# Patient Record
Sex: Female | Born: 1945 | Race: Black or African American | Hispanic: No | Marital: Married | Smoking: Never smoker
Health system: Southern US, Community
[De-identification: ages and names within clinical notes are randomized; demographics above are authoritative.]

## PROBLEM LIST (undated history)

## (undated) DIAGNOSIS — I10 Essential (primary) hypertension: Secondary | ICD-10-CM

## (undated) DIAGNOSIS — E7800 Pure hypercholesterolemia, unspecified: Secondary | ICD-10-CM

## (undated) DIAGNOSIS — I2510 Atherosclerotic heart disease of native coronary artery without angina pectoris: Secondary | ICD-10-CM

## (undated) HISTORY — PX: TONSILLECTOMY: SUR1361

## (undated) HISTORY — PX: CARDIAC CATHETERIZATION: SHX172

## (undated) HISTORY — PX: ABDOMINAL HYSTERECTOMY: SHX81

---

## 2006-06-04 ENCOUNTER — Emergency Department (HOSPITAL_COMMUNITY): Admission: EM | Admit: 2006-06-04 | Discharge: 2006-06-04 | Payer: Self-pay | Admitting: Emergency Medicine

## 2006-12-10 IMAGING — CR DG CHEST 2V
2 series · 2 of 2 positions shown · non-contrast
Comparison: None.

CLINICAL DATA: Cough, weakness. 
 CHEST ? 2 VIEW:

[view not recorded (1 of 2)]
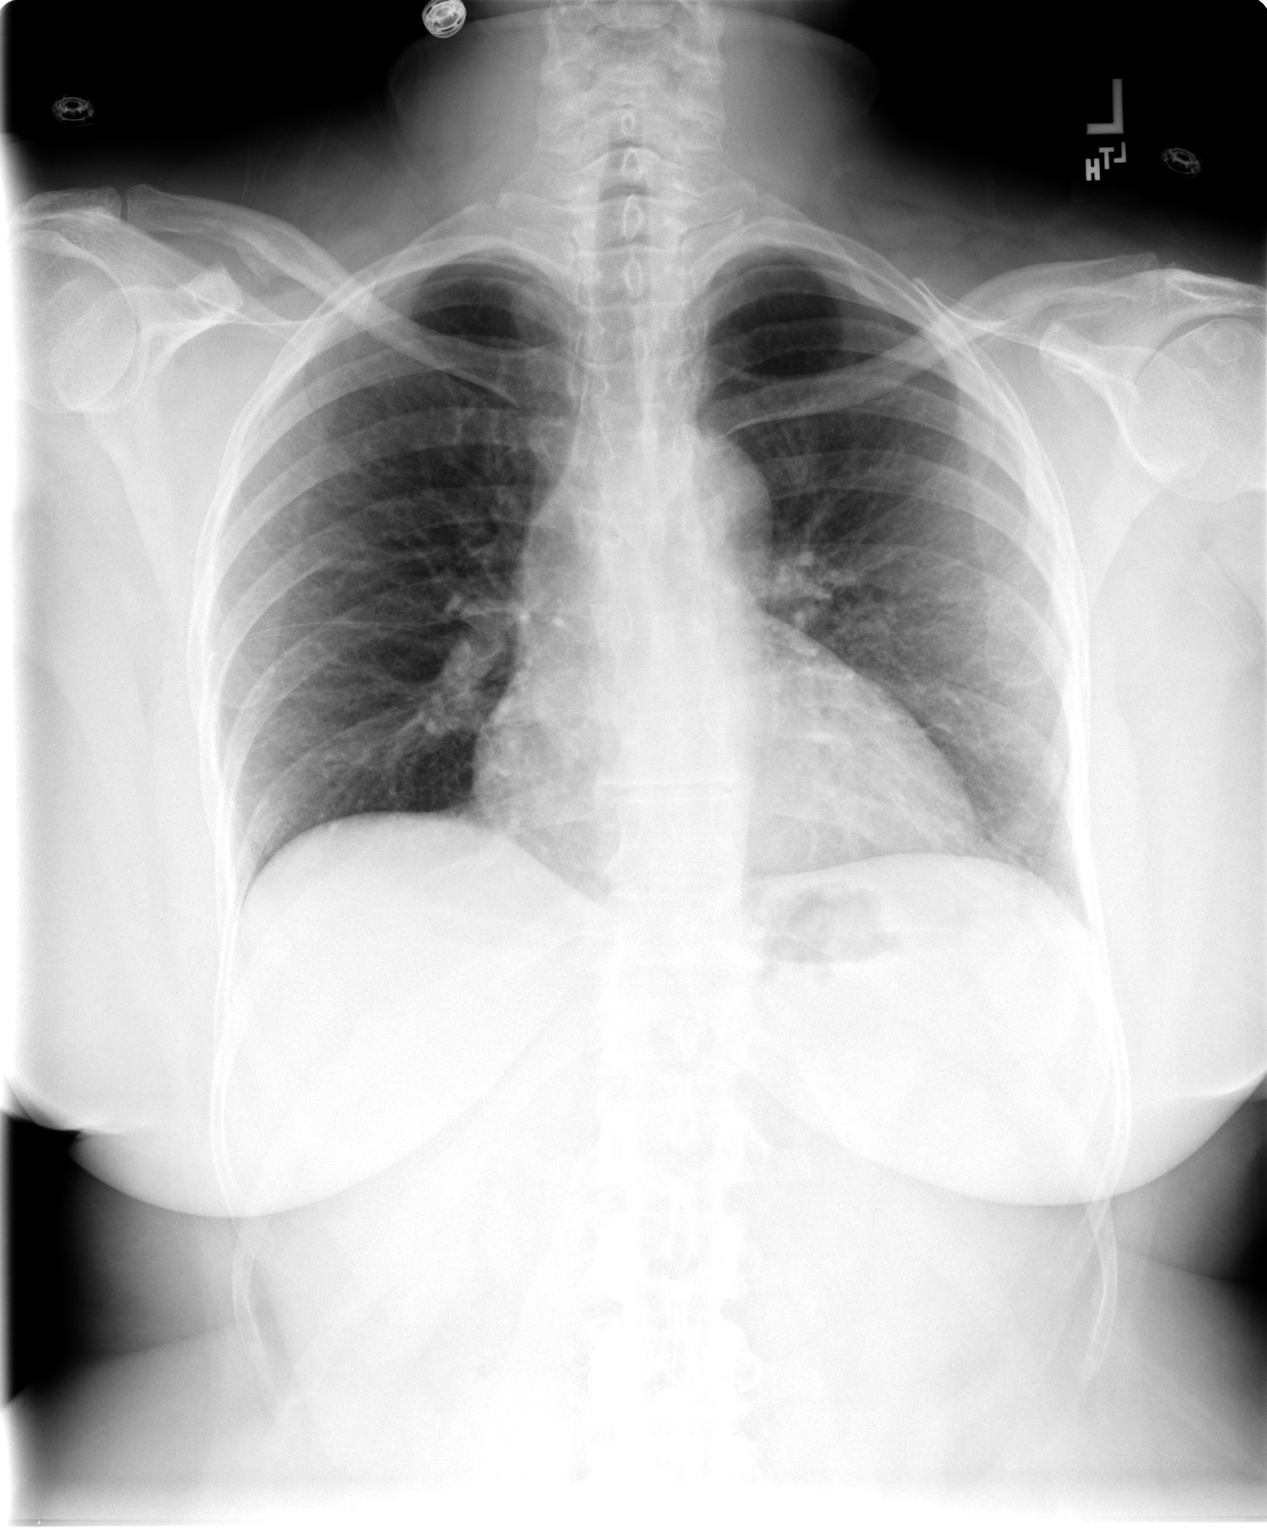

[view not recorded (2 of 2)]
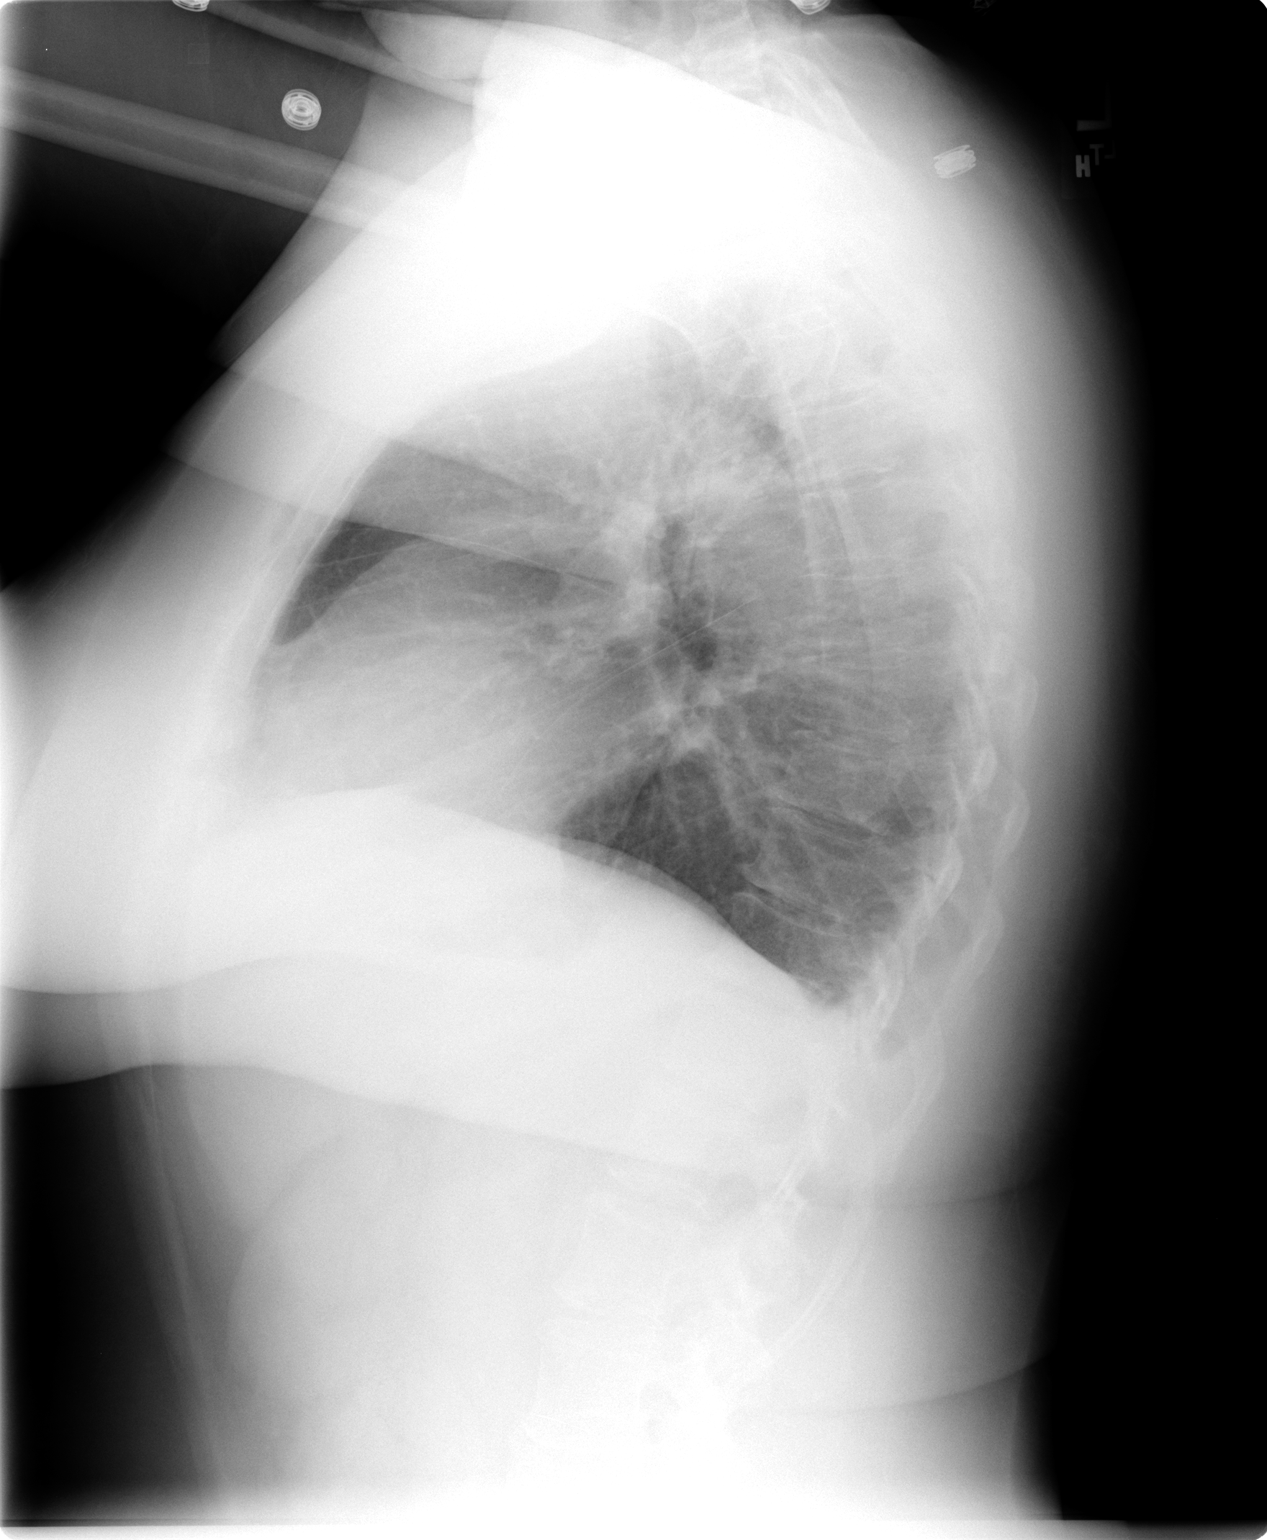

[2 of 2 positions shown; findings below may reference images not displayed]

FINDINGS: Cardiomediastinal silhouette is within normal limits.  Lung volumes are low with crowding of the bronchovascular markings but no focal pulmonary opacity.  No pleural effusion.  Osseous structures are grossly intact.
IMPRESSION: Low lung volumes.  No focal pulmonary process.

## 2011-07-18 ENCOUNTER — Other Ambulatory Visit: Payer: Self-pay

## 2011-07-18 ENCOUNTER — Encounter: Payer: Self-pay | Admitting: *Deleted

## 2011-07-18 ENCOUNTER — Emergency Department (HOSPITAL_COMMUNITY): Payer: Medicare Other

## 2011-07-18 ENCOUNTER — Emergency Department (HOSPITAL_COMMUNITY)
Admission: EM | Admit: 2011-07-18 | Discharge: 2011-07-18 | Disposition: A | Discharge status: Home / Self Care | Payer: Medicare Other | Attending: Emergency Medicine | Admitting: Emergency Medicine

## 2011-07-18 DIAGNOSIS — J45909 Unspecified asthma, uncomplicated: Secondary | ICD-10-CM | POA: Insufficient documentation

## 2011-07-18 DIAGNOSIS — I2510 Atherosclerotic heart disease of native coronary artery without angina pectoris: Secondary | ICD-10-CM | POA: Insufficient documentation

## 2011-07-18 DIAGNOSIS — I498 Other specified cardiac arrhythmias: Secondary | ICD-10-CM | POA: Insufficient documentation

## 2011-07-18 DIAGNOSIS — E876 Hypokalemia: Secondary | ICD-10-CM | POA: Insufficient documentation

## 2011-07-18 DIAGNOSIS — Z79899 Other long term (current) drug therapy: Secondary | ICD-10-CM | POA: Insufficient documentation

## 2011-07-18 DIAGNOSIS — E7800 Pure hypercholesterolemia, unspecified: Secondary | ICD-10-CM | POA: Insufficient documentation

## 2011-07-18 DIAGNOSIS — I10 Essential (primary) hypertension: Secondary | ICD-10-CM | POA: Insufficient documentation

## 2011-07-18 DIAGNOSIS — R9431 Abnormal electrocardiogram [ECG] [EKG]: Secondary | ICD-10-CM | POA: Insufficient documentation

## 2011-07-18 DIAGNOSIS — R079 Chest pain, unspecified: Secondary | ICD-10-CM | POA: Insufficient documentation

## 2011-07-18 HISTORY — DX: Essential (primary) hypertension: I10

## 2011-07-18 HISTORY — DX: Pure hypercholesterolemia, unspecified: E78.00

## 2011-07-18 HISTORY — DX: Atherosclerotic heart disease of native coronary artery without angina pectoris: I25.10

## 2011-07-18 LAB — BASIC METABOLIC PANEL
BUN: 8 mg/dL (ref 6–23)
Calcium: 9.8 mg/dL (ref 8.4–10.5)
Chloride: 101 mEq/L (ref 96–112)
Creatinine, Ser: 0.62 mg/dL (ref 0.50–1.10)
GFR calc Af Amer: >60 mL/min (ref >60)
GFR calc non Af Amer: >60 mL/min (ref >60)
Glucose, Bld: 108 mg/dL — ABNORMAL HIGH (ref 70–99)
Sodium: 140 mEq/L (ref 135–145)

## 2011-07-18 LAB — CBC
HCT: 41.4 % (ref 36.0–46.0)
Hemoglobin: 14.3 g/dL (ref 12.0–15.0)
MCV: 87 fL (ref 78.0–100.0)
Platelets: 160 10*3/uL (ref 150–400)

## 2011-07-18 LAB — CARDIAC PANEL(CRET KIN+CKTOT+MB+TROPI): CK, MB: 2.9 ng/mL (ref 0.3–4.0)

## 2011-07-18 NOTE — ED Notes (Signed)
Pt self ambulated out with a steady gait stating no needs 

## 2011-07-18 NOTE — ED Notes (Signed)
Pt c/o pain in the center of her chest with shortness of breath and facial numbness since this am. Pt also c/o headache. States that she took 2 nitroglycerins this am and a baby asa with relief.

## 2011-07-18 NOTE — ED Notes (Signed)
Assisted pt to bathroom

## 2011-07-18 NOTE — ED Provider Notes (Addendum)
Scribed for Nicholes Stairs, MD, the patient was seen in room 9. This chart was scribed by Ellie Lunch. This patient's care was started at 13:42.   CSN: [id number] Arrival date & time: 07/18/2011 11:53 AM  Chief Complaint  Patient presents with  . Chest Pain   HPI Amber Clay is a 65 y.o. female with a history of CAD, HTN, and high cholesterol presents to the Emergency Department complaining of intermittent left chest pain. Patient woke up this morning to a non-radiating, tightness in her left chest. She says pain lasted ~30 minutes and resolved after and 1 nitroglycerin. Later she was able to go on a walk and go shopping and was free of chest pain. The later in the day, the chest tightness returned after eating; patient took another nitroglycerin which again alleviated pain within a few minutes. Patient says both episodes of chest pain were accompanied with a "hot" feeling but she denies nausea, vomiting, diaphoresis, SOB, or chest pain on exertion. She denies a history of DM, liver or kidney issues, or tobacco use. She takes 81mg  ASA daily.  Patient has a history of cardiac catheterization in [place] in ~ 2009-2010, she was prescribed nitroglycerin but did not receive a stent at that time. Her Cardiologist is in [place] and she will be returning there in ~3 days. There are no other associated symptoms and no other alleviating or aggravating factors.   HPI ELEMENTS:  Location: Left Chest  Onset: This morning Duration: a few minutes per episode  Timing: 2 episodes since this morning   Quality: a "tightness"   Modifying factors: improved with nitroglycerin  Context: as above  Associated symptoms: a hot feeling   Past Medical History  Diagnosis Date  . Asthma   . Hypertension   . High cholesterol   . Coronary artery disease     Past Surgical History  Procedure Date  . Abdominal hysterectomy   . Tonsillectomy   . Cardiac catheterization    MEDICATIONS:    Nitroglycerin  Previous Medications   ACETAMINOPHEN (TYLENOL) 500 MG TABLET    Take 500 mg by mouth every 6 (six) hours as needed. FOR PAIN    ALBUTEROL (VENTOLIN HFA) 108 (90 BASE) MCG/ACT INHALER    Inhale 2 puffs into the lungs every 6 (six) hours as needed. FOR SHORTNESS OF BREATH    ALPRAZOLAM (XANAX) 0.5 MG TABLET    Take 0.25 mg by mouth at bedtime as needed. FOR ANXIETY    AMLODIPINE-BENAZEPRIL (LOTREL) 5-20 MG PER CAPSULE    Take 1 capsule by mouth daily.     CARBOXYMETHYLCELLULOSE (REFRESH PLUS) 0.5 % SOLN    Place 2 drops into both eyes 3 (three) times daily as needed. FOR IRRITATED EYES    FLUTICASONE-SALMETEROL (ADVAIR DISKUS) 250-50 MCG/DOSE AEPB    Inhale 1 puff into the lungs every 12 (twelve) hours. FOR SHORTNESS OF BREATH    METOPROLOL SUCCINATE (TOPROL-XL) 25 MG 24 HR TABLET    Take 25 mg by mouth daily.     OMEPRAZOLE (PRILOSEC) 20 MG CAPSULE    Take 20 mg by mouth daily as needed. FOR HEARTBURN    VALSARTAN-HYDROCHLOROTHIAZIDE (DIOVAN-HCT) 160-12.5 MG PER TABLET    Take 1 tablet by mouth daily.       ALLERGIES:  Allergies as of 07/18/2011 - Review Complete 07/18/2011  Allergen Reaction Noted  . Shellfish allergy Swelling 07/18/2011  . Darvon Other (See Comments) 07/18/2011    FAMILY HISTORY:  No Pertinent Family History  Social History: Accompanied by mother Patient lives in [place]  History  Substance Use Topics  . Smoking status: Never Smoker   . Smokeless tobacco: Not on file  . Alcohol Use: No    Review of Systems  Constitutional: Negative for diaphoresis.  Respiratory: Negative for shortness of breath.   Cardiovascular: Positive for chest pain.  Gastrointestinal: Negative for nausea and vomiting.  All other systems reviewed and are negative.    Physical Exam  BP 113/74  Pulse 55  Temp(Src) 98.3 F (36.8 C) (Oral)  Resp 18  Ht 5\' 4"  (1.626 m)  Wt 152 lb (68.947 kg)  BMI 26.09 kg/m2  SpO2 99%  Physical Exam  Constitutional: She is  oriented to person, place, and time. She appears well-developed and well-nourished.  HENT:  Head: Normocephalic and atraumatic.  Mouth/Throat: Oropharynx is clear and moist.  Eyes: Conjunctivae and EOM are normal. Pupils are equal, round, and reactive to light.  Neck: Normal range of motion. Neck supple. Carotid bruit is not present.  Cardiovascular: Normal rate, regular rhythm and intact distal pulses.   No murmur heard.      No bruit left or right  Pulmonary/Chest: Effort normal and breath sounds normal. No respiratory distress. She exhibits no tenderness.  Abdominal: Soft. There is no tenderness.  Musculoskeletal: Normal range of motion. She exhibits no edema and no tenderness.       No calf tenderness   Neurological: She is alert and oriented to person, place, and time.  Skin: Skin is warm and dry.  Psychiatric: She has a normal mood and affect. Her behavior is normal.    OTHER DATA REVIEWED: Nursing notes, vital signs, and past medical records reviewed.  DIAGNOSTIC STUDIES: Oxygen Saturation is 99% on 3 liters/min via Patient connected to nasal cannula oxygen, normal by my interpretation.    EKG  Date: 07/18/2011  Rate: 72  Rhythm: normal sinus rhythm and sinus arrythmia  QRS Axis: normal  Intervals: QT prolonged  ST/T Wave abnormalities: nonspecific ST/T changes  Conduction Disutrbances: none  Old EKG Reviewed: none available  LABS / RADIOLOGY:  Results for orders placed during the hospital encounter of 07/18/11  CBC      Component Value Range   WBC 5.9  4.0 - 10.5 (K/uL)   RBC 4.76  3.87 - 5.11 (MIL/uL)   Hemoglobin 14.3  12.0 - 15.0 (g/dL)   HCT 40.9  81.1 - 91.4 (%)   MCV 87.0  78.0 - 100.0 (fL)   MCH 30.0  26.0 - 34.0 (pg)   MCHC 34.5  30.0 - 36.0 (g/dL)   RDW 78.2  95.6 - 21.3 (%)   Platelets 160  150 - 400 (K/uL)  BASIC METABOLIC PANEL      Component Value Range   Sodium 140  135 - 145 (mEq/L)   Potassium 3.2 (*) 3.5 - 5.1 (mEq/L)   Chloride 101  96 -  112 (mEq/L)   CO2 22  19 - 32 (mEq/L)   Glucose, Bld 108 (*) 70 - 99 (mg/dL)   BUN 8  6 - 23 (mg/dL)   Creatinine, Ser 0.86  0.50 - 1.10 (mg/dL)   Calcium 9.8  8.4 - 57.8 (mg/dL)   GFR calc non Af Amer >60  >60 (mL/min)   GFR calc Af Amer >60  >60 (mL/min)  CARDIAC PANEL(CRET KIN+CKTOT+MB+TROPI)      Component Value Range   Total CK 118  7 - 177 (U/L)   CK, MB 2.9  0.3 -  4.0 (ng/mL)   Troponin I <0.30  <0.30 (ng/mL)   Relative Index 2.5  0.0 - 2.5    CHEST XRAY : 2 View; Interpreted by Radiologist Dr. Arnell Sieving, M.D. and reviewed by me: No acute cardiopulmonary disease.   ED COURSE / COORDINATION OF CARE: 14:04 - ED physician discussed results with the patient and family, advised patient to return to ED if chest pain comes back or persists for a longer period of time. Labs show mild hypokalemia, ED physician advised patient to eat bananas and drink orange juice.   MDM:  She has does not smoke or have dm, htn, hyperchol.  She was catheterized in [place] in 2009. She was told she did not have severe disease, so no stent was place. However, she was given ntg. Now when she gets cp or even "fluttering" in her heart she takes ntg.  She had chest 'tightness" today while at rest and when eating. She had no other sxs.   She did not get cp while walking.  Her tightness went away with a single ntg twice.  Her ecg and blood tests do not show ami or nonstemi.  She lives close by.  I do not think she has angina nor unstable angina.  Will release.  She can easily return for worse symptoms. She understands and agrees with plan.   I personally performed the services described in this documentation, which was scribed in my presence. The recorded information has been reviewed and considered. No att. providers found     IMPRESSION: Diagnoses that have been ruled out:  Diagnoses that are still under consideration:  Final diagnoses:  Chest pain, unspecified    PLAN:  Home  The patient is to  return the emergency department if there is any worsening of symptoms. I have reviewed the discharge instructions with the patient.   CONDITION ON DISCHARGE: Stable   MEDICATIONS GIVEN IN THE E.D.  Medications  Fluticasone-Salmeterol (ADVAIR DISKUS) 250-50 MCG/DOSE AEPB (not administered)  albuterol (VENTOLIN HFA) 108 (90 BASE) MCG/ACT inhaler (not administered)  valsartan-hydrochlorothiazide (DIOVAN-HCT) 160-12.5 MG per tablet (not administered)  metoprolol succinate (TOPROL-XL) 25 MG 24 hr tablet (not administered)  amLODipine-benazepril (LOTREL) 5-20 MG per capsule (not administered)  ALPRAZolam (XANAX) 0.5 MG tablet (not administered)  carboxymethylcellulose (REFRESH PLUS) 0.5 % SOLN (not administered)  omeprazole (PRILOSEC) 20 MG capsule (not administered)  acetaminophen (TYLENOL) 500 MG tablet (not administered)     DISCHARGE MEDICATIONS: New Prescriptions   No medications on file    Procedures       Nicholes Stairs, MD 07/18/11 1557  Nicholes Stairs, MD 07/18/11 1558

## 2017-01-11 ENCOUNTER — Other Ambulatory Visit (INDEPENDENT_AMBULATORY_CARE_PROVIDER_SITE_OTHER): Payer: Self-pay | Admitting: Orthopaedic Surgery

## 2017-01-11 DIAGNOSIS — M25511 Pain in right shoulder: Secondary | ICD-10-CM

## 2018-01-24 ENCOUNTER — Other Ambulatory Visit: Payer: Self-pay

## 2018-01-24 ENCOUNTER — Emergency Department (HOSPITAL_COMMUNITY)
Admission: EM | Admit: 2018-01-24 | Discharge: 2018-01-24 | Disposition: A | Discharge status: Home / Self Care | Payer: Medicare Other | Attending: Emergency Medicine | Admitting: Emergency Medicine

## 2018-01-24 ENCOUNTER — Encounter (HOSPITAL_COMMUNITY): Payer: Self-pay

## 2018-01-24 ENCOUNTER — Emergency Department (HOSPITAL_COMMUNITY): Payer: Medicare Other

## 2018-01-24 DIAGNOSIS — Z79899 Other long term (current) drug therapy: Secondary | ICD-10-CM | POA: Diagnosis not present

## 2018-01-24 DIAGNOSIS — I10 Essential (primary) hypertension: Secondary | ICD-10-CM | POA: Insufficient documentation

## 2018-01-24 DIAGNOSIS — I2510 Atherosclerotic heart disease of native coronary artery without angina pectoris: Secondary | ICD-10-CM | POA: Diagnosis not present

## 2018-01-24 DIAGNOSIS — E876 Hypokalemia: Secondary | ICD-10-CM | POA: Diagnosis not present

## 2018-01-24 DIAGNOSIS — J45909 Unspecified asthma, uncomplicated: Secondary | ICD-10-CM | POA: Insufficient documentation

## 2018-01-24 DIAGNOSIS — R0789 Other chest pain: Secondary | ICD-10-CM

## 2018-01-24 LAB — CBC
HEMATOCRIT: 42.7 % (ref 36.0–46.0)
HEMOGLOBIN: 14.1 g/dL (ref 12.0–15.0)
MCH: 29 pg (ref 26.0–34.0)
MCHC: 33 g/dL (ref 30.0–36.0)
MCV: 87.7 fL (ref 78.0–100.0)
Platelets: 189 10*3/uL (ref 150–400)
RBC: 4.87 MIL/uL (ref 3.87–5.11)
RDW: 12.8 % (ref 11.5–15.5)
WBC: 6.3 10*3/uL (ref 4.0–10.5)

## 2018-01-24 LAB — BASIC METABOLIC PANEL
ANION GAP: 13 (ref 5–15)
BUN: 10 mg/dL (ref 6–20)
CHLORIDE: 102 mmol/L (ref 101–111)
CO2: 25 mmol/L (ref 22–32)
Calcium: 9.5 mg/dL (ref 8.9–10.3)
Creatinine, Ser: 0.9 mg/dL (ref 0.44–1.00)
Glucose, Bld: 115 mg/dL — ABNORMAL HIGH (ref 65–99)
POTASSIUM: 3.2 mmol/L — AB (ref 3.5–5.1)
SODIUM: 140 mmol/L (ref 135–145)

## 2018-01-24 LAB — I-STAT TROPONIN, ED
TROPONIN I, POC: 0 ng/mL (ref 0.00–0.08)
Troponin i, poc: 0 ng/mL (ref 0.00–0.08)

## 2018-01-24 MED ORDER — POTASSIUM CHLORIDE CRYS ER 20 MEQ PO TBCR
20.0000 meq | EXTENDED_RELEASE_TABLET | Freq: Once | ORAL | Status: AC
  Administered 2018-01-24: 20 meq via ORAL
  Filled 2018-01-24: qty 1

## 2018-01-24 MED ORDER — IPRATROPIUM-ALBUTEROL 0.5-2.5 (3) MG/3ML IN SOLN
3.0000 mL | Freq: Once | RESPIRATORY_TRACT | Status: AC
  Administered 2018-01-24: 3 mL via RESPIRATORY_TRACT
  Filled 2018-01-24: qty 3

## 2018-01-24 MED ORDER — POTASSIUM CHLORIDE CRYS ER 20 MEQ PO TBCR: 20.0000 meq | EXTENDED_RELEASE_TABLET | Freq: Every day | ORAL | 0 refills | Status: AC

## 2018-01-24 NOTE — ED Notes (Signed)
Denies any chest pain at present time.

## 2018-01-24 NOTE — ED Provider Notes (Signed)
[facility]ERGENCY DEPARTMENT Provider Note   CSN: [id number] Arrival date & time: 01/24/18  1041     History   Chief Complaint Chief Complaint  Patient presents with  . Chest Pain    HPI Amber Clay is a 72 y.o. female.  Pt presents to the ED today with CP that started light night.  The pt said she feels like it's a "flutter."  She lives in [place], but has been here b/c her mom is scheduled  for a heart cath [facility] a few days.  She is a little anxious about that.  She did have a cath in 2009 in [place].  She is not sure if she had a stent or not.       Past Medical History:  Diagnosis Date  . Asthma   . Coronary artery disease   . High cholesterol   . Hypertension     There are no active problems to display for this patient.   Past Surgical History:  Procedure Laterality Date  . ABDOMINAL HYSTERECTOMY    . CARDIAC CATHETERIZATION    . TONSILLECTOMY      OB History    No data available       Home Medications    Prior to Admission medications   Medication Sig Start Date End Date Taking? Authorizing Provider  acetaminophen (TYLENOL) 500 MG tablet Take 500 mg by mouth every 6 (six) hours as needed. FOR PAIN    Yes [provider]  albuterol (VENTOLIN HFA) 108 (90 BASE) MCG/ACT inhaler Inhale 2 puffs into the lungs every 6 (six) hours as needed. FOR SHORTNESS OF BREATH    Yes [provider]  ALPRAZolam (XANAX) 0.5 MG tablet Take 0.25 mg by mouth at bedtime as needed. FOR ANXIETY    Yes [provider]  amLODipine-benazepril (LOTREL) 5-20 MG per capsule Take 1 capsule by mouth daily.     Yes [provider]  carboxymethylcellulose (REFRESH PLUS) 0.5 % SOLN Place 2 drops into both eyes 3 (three) times daily as needed. FOR IRRITATED EYES    Yes [provider]  Fluticasone-Salmeterol (ADVAIR DISKUS) 250-50 MCG/DOSE AEPB Inhale 1 puff into the lungs every 12 (twelve) hours. FOR  SHORTNESS OF BREATH    Yes [provider]  meclizine (ANTIVERT) 25 MG tablet Take 25 mg by mouth 3 (three) times daily. 01/08/18  Yes [provider]  metoprolol succinate (TOPROL-XL) 25 MG 24 hr tablet Take 25 mg by mouth daily.     Yes [provider]  omeprazole (PRILOSEC) 20 MG capsule Take 20 mg by mouth daily as needed. FOR HEARTBURN    Yes [provider]  valsartan-hydrochlorothiazide (DIOVAN-HCT) 160-12.5 MG per tablet Take 1 tablet by mouth daily.     Yes [provider]  potassium chloride SA (K-DUR,KLOR-CON) 20 MEQ tablet Take 1 tablet (20 mEq total) by mouth daily. 01/24/18   Jacalyn LefevreHaviland, Carson Meche, MD    Family History No family history on file.  Social History Social History   Tobacco Use  . Smoking status: Never Smoker  . Smokeless tobacco: Never Used  Substance Use Topics  . Alcohol use: No  . Drug use: No     Allergies   Shellfish allergy and Darvon   Review of Systems Review of Systems  Cardiovascular: Positive for chest pain.  All other systems reviewed and are negative.    Physical Exam Updated Vital Signs BP 120/78   Pulse 89  Temp 97.7 F (36.5 C) (Oral)   Resp 15   Ht 5\' 4"  (1.626 m)   Wt 72.6 kg (160 lb)   SpO2 93%   BMI 27.46 kg/m   Physical Exam  Constitutional: She is oriented to person, place, and time. She appears well-developed and well-nourished.  HENT:  Head: Normocephalic and atraumatic.  Eyes: EOM are normal. Pupils are equal, round, and reactive to light.  Neck: Normal range of motion. Neck supple.  Cardiovascular: Normal rate, regular rhythm, intact distal pulses and normal pulses.  Pulmonary/Chest: Effort normal and breath sounds normal.  Abdominal: Soft. Bowel sounds are normal.  Musculoskeletal: Normal range of motion.       Right lower leg: Normal.       Left lower leg: Normal.  Neurological: She is alert and oriented to person, place, and time.  Skin: Skin is warm and dry.  Capillary refill takes less than 2 seconds.  Psychiatric: She has a normal mood and affect. Her behavior is normal.  Nursing note and vitals reviewed.    ED Treatments / Results  Labs (all labs ordered are listed, but only abnormal results are displayed) Labs Reviewed  BASIC METABOLIC PANEL - Abnormal; Notable for the following components:      Result Value   Potassium 3.2 (*)    Glucose, Bld 115 (*)    All other components within normal limits  CBC  I-STAT TROPONIN, ED  I-STAT TROPONIN, ED    EKG  EKG Interpretation  Date/Time:  Wednesday January 24 2018 11:05:51 EST Ventricular Rate:  87 PR Interval:  170 QRS Duration: 80 QT Interval:  388 QTC Calculation: 466 R Axis:   -6 Text Interpretation:  Normal sinus rhythm Inferior infarct , age undetermined Anterolateral infarct , age undetermined Abnormal ECG No old tracing to compare Confirmed by Jacalyn Lefevre [phone]) on 01/24/2018 11:10:59 AM       Radiology Dg Chest 2 View  Result Date: 01/24/2018 CLINICAL DATA:  Chest pain and shortness of breath since yesterday. EXAM: CHEST  2 VIEW COMPARISON:  Single-view of the chest 07/18/2011. PA and lateral chest 06/04/2006. FINDINGS: The lungs are clear. Heart size is upper normal. No pneumothorax or pleural effusion. No acute bony abnormality. IMPRESSION: No acute disease. Electronically Signed   By: Drusilla Kanner M.D.   On: 01/24/2018 13:11    Procedures Procedures (including critical care time)  Medications Ordered in ED Medications  potassium chloride SA (K-DUR,KLOR-CON) CR tablet 20 mEq (not administered)  ipratropium-albuterol (DUONEB) 0.5-2.5 (3) MG/3ML nebulizer solution 3 mL (3 mLs Nebulization Given 01/24/18 1159)     Initial Impression / Assessment and Plan / ED Course  I have reviewed the triage vital signs and the nursing notes.  Pertinent labs & imaging results that were available during my care of the patient were reviewed by me and considered in my  medical decision making (see chart for details).    Pt is feeling better.  Heart score of 3 with 2 negative troponins.  Pt is stable for d/c.  She knows to return if worse.  Final Clinical Impressions(s) / ED Diagnoses   Final diagnoses:  Atypical chest pain  Hypokalemia    ED Discharge Orders        Ordered    potassium chloride SA (K-DUR,KLOR-CON) 20 MEQ tablet  Daily     01/24/18 1409       Jacalyn Lefevre, MD 01/24/18 [phone]

## 2018-01-24 NOTE — ED Triage Notes (Signed)
Left arm pain that has happened for months, but chest tightness that started last night. States it's a lot of pressure. Has a stent in her heart. Rates pain as a 7/10. Pt states she has had some nausea

## 2018-08-01 IMAGING — DX DG CHEST 2V
2 series · 2 of 2 positions shown · non-contrast
Comparison: Single-view of the chest 07/18/2011. PA and lateral
chest 06/04/2006.

CLINICAL DATA: Chest pain and shortness of breath since yesterday.

EXAM:
CHEST  2 VIEW

[chest pa]
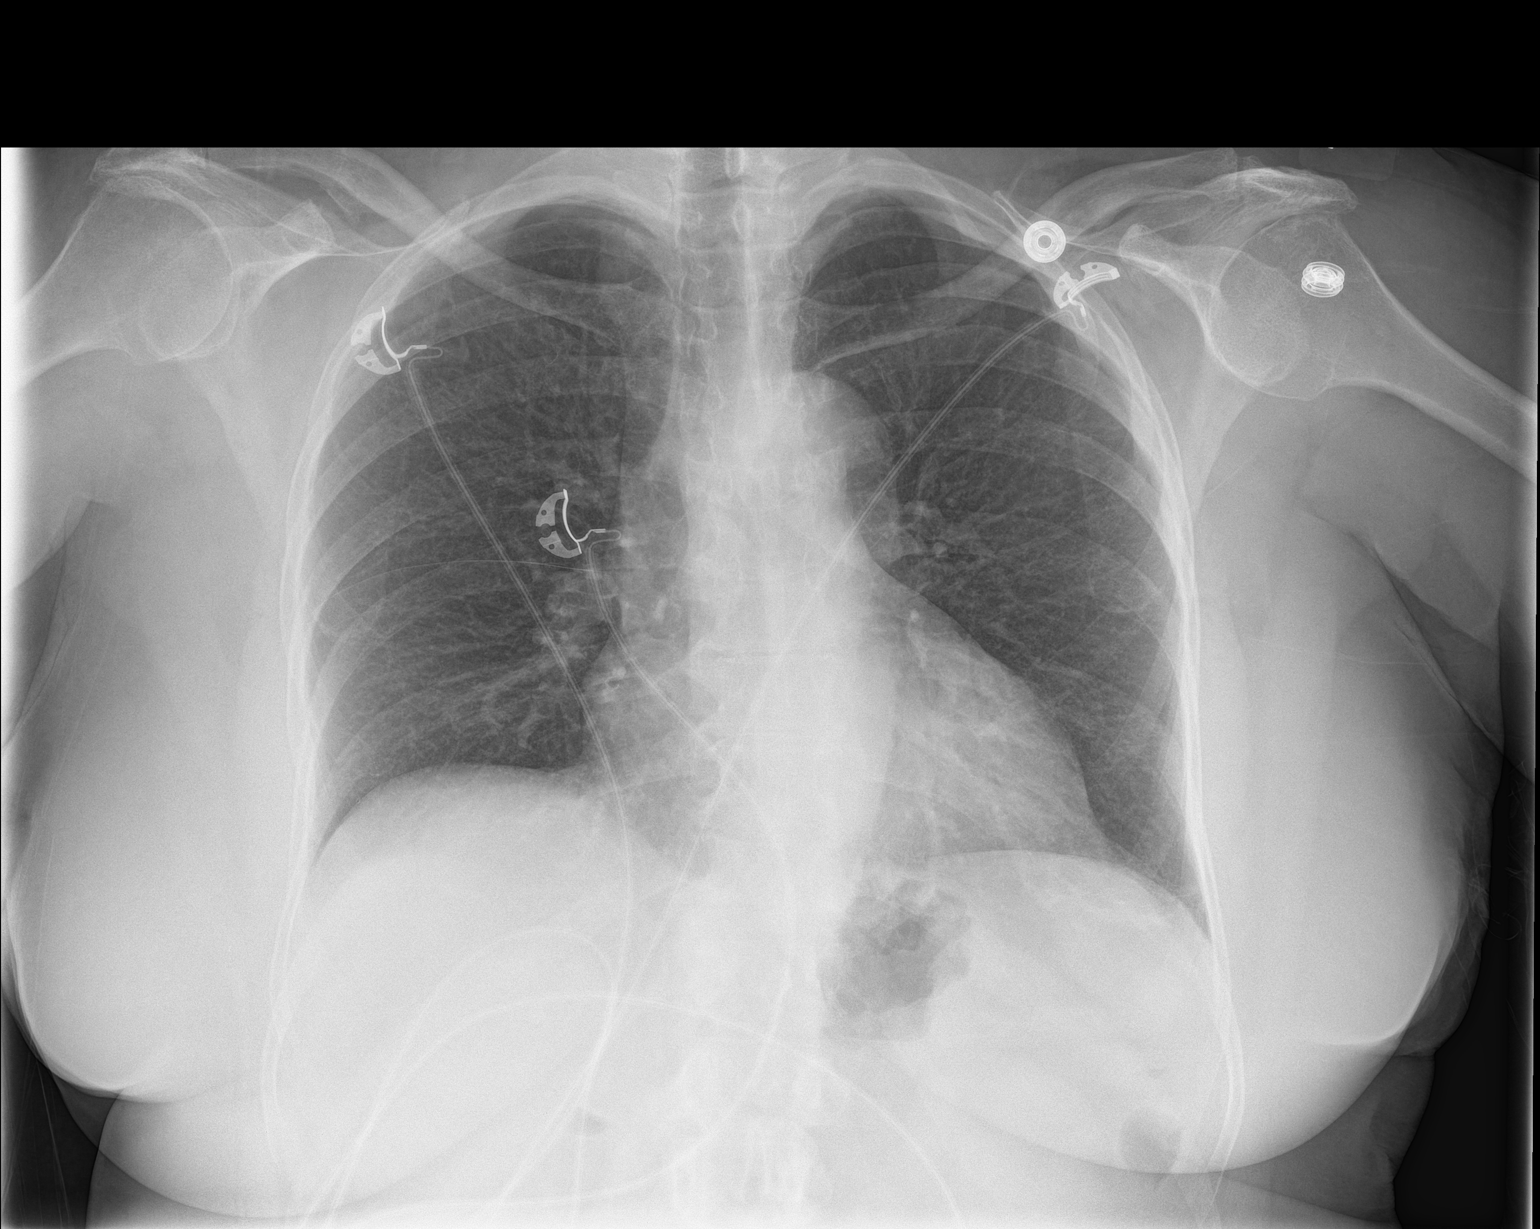

[chest lat]
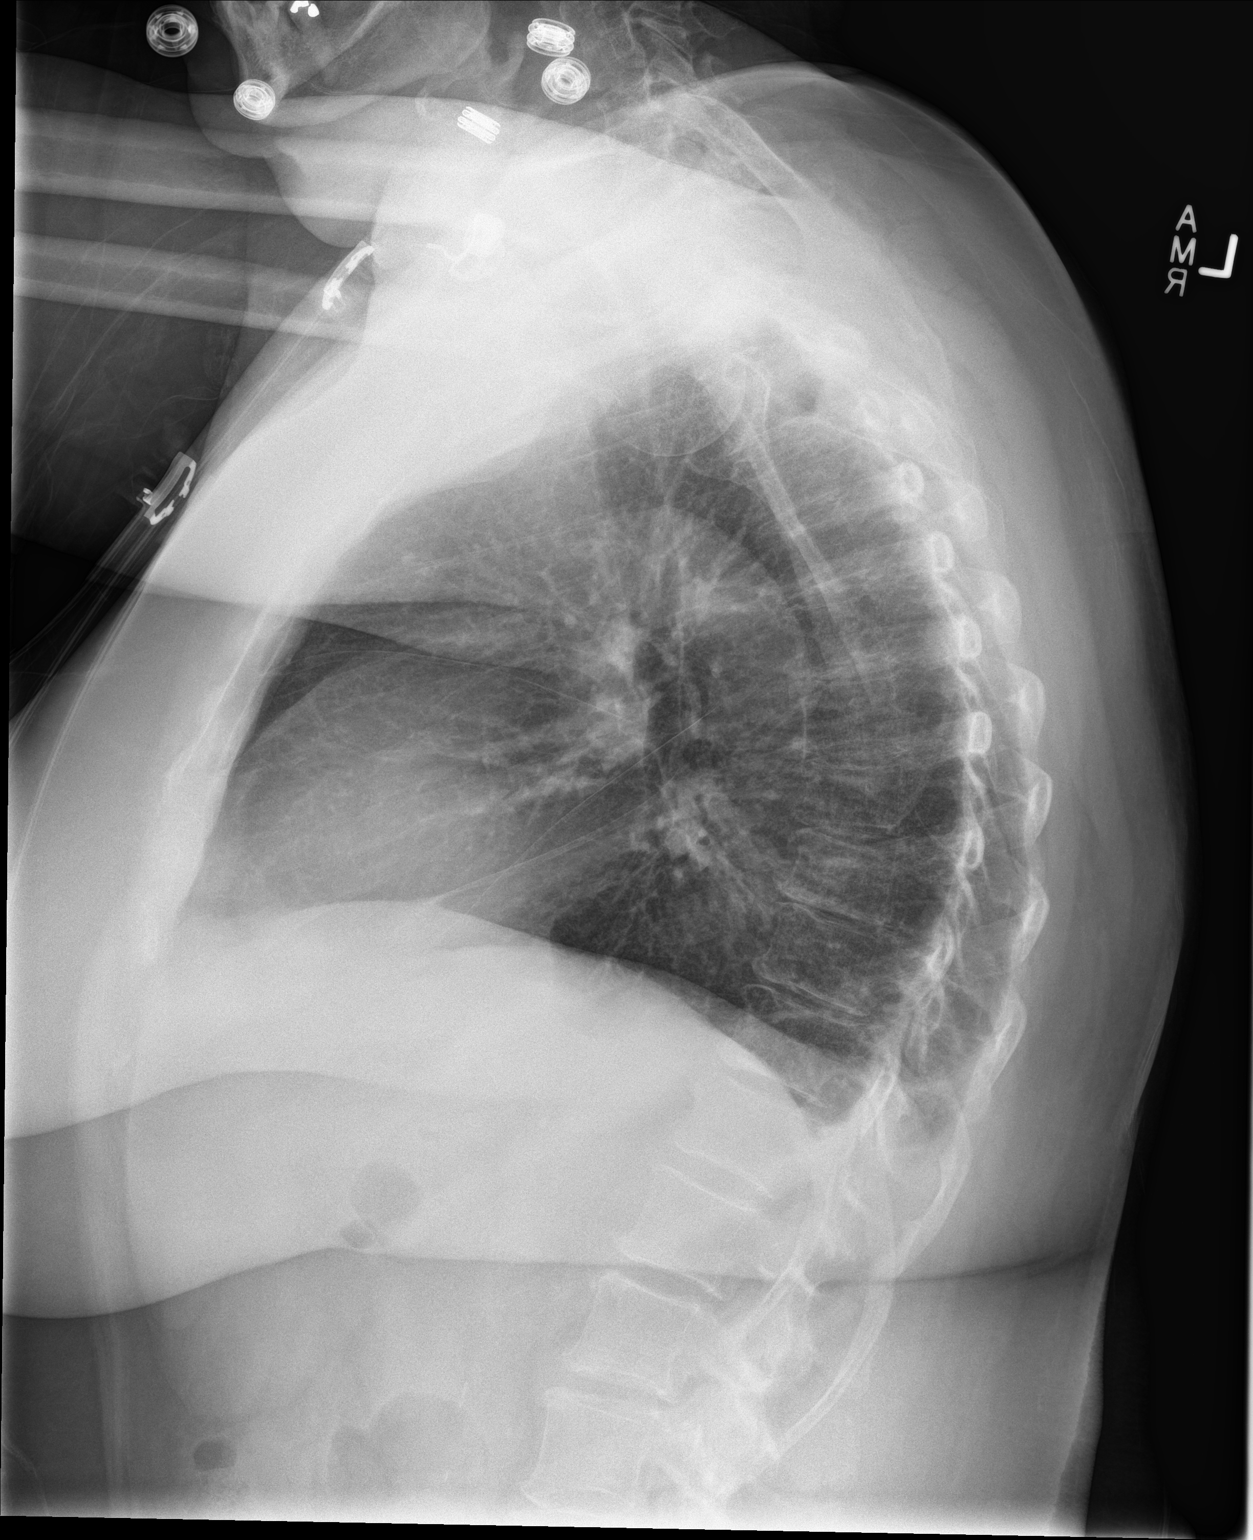

[2 of 2 positions shown; findings below may reference images not displayed]

FINDINGS: The lungs are clear. Heart size is upper normal. No pneumothorax or
pleural effusion. No acute bony abnormality.
IMPRESSION: No acute disease.

## 2019-01-07 DIAGNOSIS — R9439 Abnormal result of other cardiovascular function study: Secondary | ICD-10-CM | POA: Insufficient documentation

## 2021-02-02 DIAGNOSIS — E1165 Type 2 diabetes mellitus with hyperglycemia: Secondary | ICD-10-CM | POA: Insufficient documentation

## 2021-04-24 DIAGNOSIS — F339 Major depressive disorder, recurrent, unspecified: Secondary | ICD-10-CM | POA: Insufficient documentation

## 2022-07-01 ENCOUNTER — Encounter: Payer: Self-pay | Admitting: Orthopedic Surgery

## 2022-07-01 ENCOUNTER — Ambulatory Visit (INDEPENDENT_AMBULATORY_CARE_PROVIDER_SITE_OTHER): Payer: Medicare Other | Admitting: Orthopedic Surgery

## 2022-07-01 DIAGNOSIS — S52592A Other fractures of lower end of left radius, initial encounter for closed fracture: Secondary | ICD-10-CM | POA: Diagnosis not present

## 2022-07-01 DIAGNOSIS — K219 Gastro-esophageal reflux disease without esophagitis: Secondary | ICD-10-CM | POA: Insufficient documentation

## 2022-07-01 DIAGNOSIS — I10 Essential (primary) hypertension: Secondary | ICD-10-CM | POA: Insufficient documentation

## 2022-07-01 DIAGNOSIS — I2510 Atherosclerotic heart disease of native coronary artery without angina pectoris: Secondary | ICD-10-CM | POA: Insufficient documentation

## 2022-07-01 DIAGNOSIS — J45909 Unspecified asthma, uncomplicated: Secondary | ICD-10-CM | POA: Insufficient documentation

## 2022-07-01 NOTE — Patient Instructions (Signed)
Tylenol 500 mg every 6 hours is fine for pain  You can supplement this with ibuprofen 800 mg every 8 hours for pain

## 2022-07-01 NOTE — Progress Notes (Addendum)
Patient ID: Amber Clay, female   DOB: Jan 04, 1946, 76 y.o.   MRN: [id number]  ASSESSMENT AND PLAN  Minimally displaced distal radius fracture left wrist recommend cast  X-ray in cast in 2 weeks  Chief Complaint  Patient presents with   Wrist Injury    Left 06/28/22     HPI Amber Clay is a 76 y.o. female.   77 yo female s/p fall inj left wrist , seen at urgent care. C/o left wrist pain    Review of Systems Review of Systems Neuro sensation normal   Past Medical History:  Diagnosis Date   Asthma    Coronary artery disease    High cholesterol    Hypertension     Past Surgical History:  Procedure Laterality Date   ABDOMINAL HYSTERECTOMY     CARDIAC CATHETERIZATION     TONSILLECTOMY        Physical Exam 1 There were no vitals taken for this visit.  2 The patient is well developed well nourished and well groomed. 3 Orientation to person place and time is normal  4 Mood is pleasant.  5 Ambulatory status normal   6 Inspection of the left  wrist reveals mild  tenderness   mild  swelling no  deformity 7 Range of motion assessment: The range of motion is diminished primarily secondary to pain 8 Stability tests are deferred because of pain but the x-ray shows no subluxation of the joint 9 Strength assessment muscle tone is normal resistance testing is deferred because of pain and swelling  10 Nerve function normal  11 Vascular function normal  12 Local lymphatic system neg   Opposite extremity right there is no alignment abnormality, no contracture, no subluxation, no atrophy and neurovascular exam is intact  MEDICAL DECISION MAKING  A.  Encounter Diagnosis  Name Primary?   Other closed fracture of distal end of left radius, initial encounter Yes    B. DATA ANALYSED:  Images on a disc there now in the media nondisplaced distal radius fracture minimal to no angulation mild compression/shortening  IMAGING: Independent interpretation of images: As  above  Orders: Cast  Outside records reviewed: None  C. MANAGEMENT Short arm cast x-ray in 2 weeks in the cast patient is from [place] will be returning on 15 August  No orders of the defined types were placed in this encounter.

## 2022-07-15 ENCOUNTER — Encounter: Payer: Self-pay | Admitting: Orthopedic Surgery

## 2022-07-15 ENCOUNTER — Ambulatory Visit (INDEPENDENT_AMBULATORY_CARE_PROVIDER_SITE_OTHER): Payer: Medicare Other

## 2022-07-15 ENCOUNTER — Ambulatory Visit (INDEPENDENT_AMBULATORY_CARE_PROVIDER_SITE_OTHER): Payer: Medicare Other | Admitting: Orthopedic Surgery

## 2022-07-15 DIAGNOSIS — S52592D Other fractures of lower end of left radius, subsequent encounter for closed fracture with routine healing: Secondary | ICD-10-CM | POA: Diagnosis not present

## 2022-07-15 NOTE — Progress Notes (Signed)
Chief Complaint  Patient presents with   Wrist Injury    LT wrist 06/28/22    17 days post casting for left distal radius fracture.  X-rays show fracture alignment to be near anatomic including articular surface.   Cast is intact  X-rays look good  Patient traveling and definitive care will be done elsewhere

## 2022-07-15 NOTE — Patient Instructions (Addendum)
Injury date 06/28/2022  Colles' fracture left wrist  Cast applied July 28  Our plan was for cast off 6 weeks after injury and then removable brace and active range of motion
